# Patient Record
Sex: Female | Born: 2000
Health system: Southern US, Community
[De-identification: ages and names within clinical notes are randomized; demographics above are authoritative.]

---
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
## 2020-08-28 DIAGNOSIS — R300 Dysuria: Secondary | ICD-10-CM | POA: Diagnosis not present

## 2020-09-16 ENCOUNTER — Encounter (HOSPITAL_COMMUNITY): Payer: Self-pay

## 2020-09-16 ENCOUNTER — Ambulatory Visit (INDEPENDENT_AMBULATORY_CARE_PROVIDER_SITE_OTHER): Payer: 59

## 2020-09-16 ENCOUNTER — Other Ambulatory Visit: Payer: Self-pay

## 2020-09-16 ENCOUNTER — Ambulatory Visit (HOSPITAL_COMMUNITY)
Admission: EM | Admit: 2020-09-16 | Discharge: 2020-09-16 | Disposition: A | Discharge status: Home / Self Care | Payer: 59

## 2020-09-16 DIAGNOSIS — M25561 Pain in right knee: Secondary | ICD-10-CM | POA: Diagnosis not present

## 2020-09-16 DIAGNOSIS — M25461 Effusion, right knee: Secondary | ICD-10-CM | POA: Diagnosis not present

## 2020-09-16 DIAGNOSIS — M7989 Other specified soft tissue disorders: Secondary | ICD-10-CM | POA: Diagnosis not present

## 2020-09-16 MED ORDER — CYCLOBENZAPRINE HCL 5 MG PO TABS: 5.0000 mg | ORAL_TABLET | Freq: Every evening | ORAL | 0 refills | Status: AC | PRN

## 2020-09-16 MED ORDER — IBUPROFEN 800 MG PO TABS: 800.0000 mg | ORAL_TABLET | Freq: Three times a day (TID) | ORAL | 0 refills | Status: AC

## 2020-09-16 NOTE — Discharge Instructions (Addendum)
Can use ibuprofen three times a day with meals  Can use muscle relaxer at bedtime as needed  Can use heating pad in 15 minute intervals  Gentle range of motion exercises as tolerated  Can follow up with the urgent care or orthopedics if worsening pain, decreased movement, numbness or tingling begins

## 2020-09-16 NOTE — ED Triage Notes (Signed)
Pt c/o acute right medial knee pain, radiating to posterior thigh onset last night. Pt states as she bent down to remove clothing from dryer last night, she experienced pain to her knee.  Pt reports swelling and bruising to medial portion to knee.  Denies numbness/tingling to foot.  Took tylenol last night. Is currently being tx for UTI but has missed several doses of her ABX.

## 2020-09-16 NOTE — ED Provider Notes (Signed)
Lauren Irwin    CSN: [id number] Arrival date & time: 09/16/20  2992      History   Chief Complaint Chief Complaint  Patient presents with  . Knee Pain    HPI Lauren Irwin is a 20 y.o. female.   Patient presents with right knee pain  9/10 radiating to posterior thigh beginning yesterday evening after bending over. Painful when bearing weight. ROM intact. Reports swelling and bruising last night. Denies numbness and tingling. Attempted tylenol with no relief  History reviewed. No pertinent past medical history.  There are no problems to display for this patient.   History reviewed. No pertinent surgical history.  OB History   No obstetric history on file.      Home Medications    Prior to Admission medications   Medication Sig Start Date End Date Taking? Authorizing Provider  nitrofurantoin, macrocrystal-monohydrate, (MACROBID) 100 MG capsule Take 100 mg by mouth 2 (two) times daily. 08/28/20  Yes [provider]  albuterol (PROVENTIL HFA;VENTOLIN HFA) 108 (90 Base) MCG/ACT inhaler Inhale 1-2 puffs into the lungs every 6 (six) hours as needed for wheezing or shortness of breath. 08/18/18   Melynda Ripple, MD  fluticasone (FLONASE) 50 MCG/ACT nasal spray Place 2 sprays into both nostrils daily. 08/18/18   Melynda Ripple, MD  Spacer/Aero-Holding Chambers (AEROCHAMBER PLUS) inhaler Use as instructed 08/18/18   Melynda Ripple, MD    Family History History reviewed. No pertinent family history.  Social History Social History   Tobacco Use  . Smoking status: Never Smoker  . Smokeless tobacco: Never Used  Substance Use Topics  . Alcohol use: No  . Drug use: No     Allergies   Other   Review of Systems Review of Systems  Constitutional: Negative.   Respiratory: Negative.   Cardiovascular: Negative.   Gastrointestinal: Negative.   Genitourinary: Negative.   Musculoskeletal: Negative.   Neurological: Negative.      Physical  Exam Triage Vital Signs ED Triage Vitals  Enc Vitals Group     BP 09/16/20 1037 123/77     Pulse Rate 09/16/20 1037 86     Resp 09/16/20 1037 18     Temp 09/16/20 1037 98 F (36.7 C)     Temp Source 09/16/20 1037 Oral     SpO2 09/16/20 1037 100 %     Weight --      Height --      Head Circumference --      Peak Flow --      Pain Score 09/16/20 1033 9     Pain Loc --      Pain Edu? --      Excl. in [place]? --    No data found.  Updated Vital Signs BP 123/77 (BP Location: Right Arm)   Pulse 86   Temp 98 F (36.7 C) (Oral)   Resp 18   LMP 09/03/2020 (Approximate)   SpO2 100%   Visual Acuity Right Eye Distance:   Left Eye Distance:   Bilateral Distance:    Right Eye Near:   Left Eye Near:    Bilateral Near:     Physical Exam Constitutional:      Appearance: Normal appearance. She is normal weight.  HENT:     Head: Normocephalic.  Eyes:     Extraocular Movements: Extraocular movements intact.  Pulmonary:     Effort: Pulmonary effort is normal.  Musculoskeletal:     Right knee: No swelling, effusion, erythema or ecchymosis.  Legs:     Comments: Tenderness over MCL, above and below the patella, patient guarding and shaking during palpation.   Skin:    General: Skin is warm and dry.  Neurological:     Mental Status: She is alert and oriented to person, place, and time. Mental status is at baseline.  Psychiatric:        Mood and Affect: Mood normal.        Behavior: Behavior normal.        Thought Content: Thought content normal.        Judgment: Judgment normal.      UC Treatments / Results  Labs (all labs ordered are listed, but only abnormal results are displayed) Labs Reviewed - No data to display  EKG   Radiology No results found.  Procedures Procedures (including critical care time)  Medications Ordered in UC Medications - No data to display  Initial Impression / Assessment and Plan / UC Course  I have reviewed the triage vital signs  and the nursing notes.  Pertinent labs & imaging results that were available during my care of the patient were reviewed by me and considered in my medical decision making (see chart for details).  Acute right knee pain   1 xray negative 2. Ibuprofen 800 mg tid 3. Flexeril 5mg  at bedtime prn 4.heating pad 15 minute intervals 5. ROM exercises as tolerated 6. Follow up for worsening pain, decreased ROM, numbness tingling, verbalized understanding  Final Clinical Impressions(s) / UC Diagnoses   Final diagnoses:  None   Discharge Instructions   None    ED Prescriptions    None     PDMP not reviewed this encounter.   Hans Eden, NP 09/16/20 1144

## 2020-09-20 ENCOUNTER — Telehealth: Payer: Self-pay

## 2020-09-20 NOTE — Telephone Encounter (Signed)
Mother called for pt says pt needs her immunization record printed for college. Alternate phone number is [phone] (Mother's work)

## 2020-09-27 NOTE — Telephone Encounter (Signed)
Did you try to print this?

## 2020-09-27 NOTE — Telephone Encounter (Signed)
Yes this is Gosrani's PT there was no imm record in the chart

## 2021-05-25 ENCOUNTER — Emergency Department (HOSPITAL_COMMUNITY)
Admission: EM | Admit: 2021-05-25 | Discharge: 2021-05-26 | Disposition: A | Discharge status: Home / Self Care | Payer: 59 | Attending: Emergency Medicine | Admitting: Emergency Medicine

## 2021-05-25 ENCOUNTER — Emergency Department (HOSPITAL_COMMUNITY): Payer: 59

## 2021-05-25 ENCOUNTER — Other Ambulatory Visit: Payer: Self-pay

## 2021-05-25 ENCOUNTER — Encounter (HOSPITAL_COMMUNITY): Payer: Self-pay | Admitting: Emergency Medicine

## 2021-05-25 DIAGNOSIS — S80211A Abrasion, right knee, initial encounter: Secondary | ICD-10-CM | POA: Insufficient documentation

## 2021-05-25 DIAGNOSIS — M25562 Pain in left knee: Secondary | ICD-10-CM

## 2021-05-25 DIAGNOSIS — Y92410 Unspecified street and highway as the place of occurrence of the external cause: Secondary | ICD-10-CM | POA: Insufficient documentation

## 2021-05-25 DIAGNOSIS — T148XXA Other injury of unspecified body region, initial encounter: Secondary | ICD-10-CM

## 2021-05-25 DIAGNOSIS — Z23 Encounter for immunization: Secondary | ICD-10-CM | POA: Diagnosis not present

## 2021-05-25 DIAGNOSIS — S8991XA Unspecified injury of right lower leg, initial encounter: Secondary | ICD-10-CM | POA: Diagnosis present

## 2021-05-25 DIAGNOSIS — S299XXA Unspecified injury of thorax, initial encounter: Secondary | ICD-10-CM | POA: Diagnosis not present

## 2021-05-25 DIAGNOSIS — S70212A Abrasion, left hip, initial encounter: Secondary | ICD-10-CM | POA: Insufficient documentation

## 2021-05-25 DIAGNOSIS — R000 Tachycardia, unspecified: Secondary | ICD-10-CM | POA: Diagnosis not present

## 2021-05-25 DIAGNOSIS — R079 Chest pain, unspecified: Secondary | ICD-10-CM | POA: Diagnosis not present

## 2021-05-25 DIAGNOSIS — M25561 Pain in right knee: Secondary | ICD-10-CM | POA: Diagnosis not present

## 2021-05-25 DIAGNOSIS — M25552 Pain in left hip: Secondary | ICD-10-CM | POA: Diagnosis not present

## 2021-05-25 DIAGNOSIS — S80919A Unspecified superficial injury of unspecified knee, initial encounter: Secondary | ICD-10-CM | POA: Diagnosis not present

## 2021-05-25 DIAGNOSIS — S199XXA Unspecified injury of neck, initial encounter: Secondary | ICD-10-CM | POA: Diagnosis not present

## 2021-05-25 DIAGNOSIS — R0789 Other chest pain: Secondary | ICD-10-CM | POA: Diagnosis not present

## 2021-05-25 DIAGNOSIS — S80212A Abrasion, left knee, initial encounter: Secondary | ICD-10-CM | POA: Insufficient documentation
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
## 2021-05-26 ENCOUNTER — Emergency Department (HOSPITAL_COMMUNITY): Payer: 59

## 2021-05-26 DIAGNOSIS — M25552 Pain in left hip: Secondary | ICD-10-CM | POA: Diagnosis not present

## 2021-05-26 DIAGNOSIS — S80212A Abrasion, left knee, initial encounter: Secondary | ICD-10-CM | POA: Diagnosis not present

## 2021-05-26 DIAGNOSIS — S299XXA Unspecified injury of thorax, initial encounter: Secondary | ICD-10-CM | POA: Diagnosis not present

## 2021-05-26 DIAGNOSIS — S80211A Abrasion, right knee, initial encounter: Secondary | ICD-10-CM | POA: Diagnosis not present

## 2021-05-26 DIAGNOSIS — Z23 Encounter for immunization: Secondary | ICD-10-CM | POA: Diagnosis not present

## 2021-05-26 DIAGNOSIS — S70212A Abrasion, left hip, initial encounter: Secondary | ICD-10-CM | POA: Diagnosis not present

## 2021-05-26 LAB — POC URINE PREG, ED: Preg Test, Ur: NEGATIVE

## 2021-05-26 MED ORDER — TETANUS-DIPHTH-ACELL PERTUSSIS 5-2.5-18.5 LF-MCG/0.5 IM SUSY
0.5000 mL | PREFILLED_SYRINGE | Freq: Once | INTRAMUSCULAR | Status: AC
  Administered 2021-05-26: 0.5 mL via INTRAMUSCULAR
  Filled 2021-05-26: qty 0.5

## 2021-05-26 MED ORDER — ACETAMINOPHEN 325 MG PO TABS
650.0000 mg | ORAL_TABLET | Freq: Once | ORAL | Status: AC
  Administered 2021-05-26: 650 mg via ORAL
  Filled 2021-05-26: qty 2

## 2021-05-26 MED ORDER — NAPROXEN 500 MG PO TABS: 500.0000 mg | ORAL_TABLET | Freq: Two times a day (BID) | ORAL | 0 refills | Status: DC

## 2021-05-26 MED ORDER — BACITRACIN ZINC 500 UNIT/GM EX OINT: 1.0000 "application " | TOPICAL_OINTMENT | Freq: Two times a day (BID) | CUTANEOUS | 0 refills | Status: DC

## 2021-05-26 MED ORDER — NAPROXEN 500 MG PO TABS: 500.0000 mg | ORAL_TABLET | Freq: Two times a day (BID) | ORAL | 0 refills | Status: AC

## 2021-05-26 MED ORDER — LIDOCAINE 5 % EX PTCH: 1.0000 | MEDICATED_PATCH | CUTANEOUS | 0 refills | Status: AC

## 2021-05-26 MED ORDER — BACITRACIN ZINC 500 UNIT/GM EX OINT: 1.0000 "application " | TOPICAL_OINTMENT | Freq: Two times a day (BID) | CUTANEOUS | 0 refills | Status: AC

## 2021-05-26 NOTE — ED Notes (Signed)
Pt verbalizes understanding of discharge instructions. Opportunity for questions and answers were provided. Pt discharged from the ED.   ?
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
## 2021-05-30 DIAGNOSIS — M62838 Other muscle spasm: Secondary | ICD-10-CM | POA: Diagnosis not present

## 2021-05-30 DIAGNOSIS — R0789 Other chest pain: Secondary | ICD-10-CM | POA: Diagnosis not present

## 2021-05-30 DIAGNOSIS — M546 Pain in thoracic spine: Secondary | ICD-10-CM | POA: Diagnosis not present

## 2021-05-30 DIAGNOSIS — G4709 Other insomnia: Secondary | ICD-10-CM | POA: Diagnosis not present

## 2022-04-07 DIAGNOSIS — D229 Melanocytic nevi, unspecified: Secondary | ICD-10-CM | POA: Diagnosis not present

## 2022-04-23 ENCOUNTER — Telehealth: Payer: Self-pay | Admitting: Plastic Surgery

## 2022-04-23 NOTE — Telephone Encounter (Signed)
LVM to see if pt could come in at 8:15 instead of 8:30 due to provider needing 45 minute consult time frame.

## 2022-05-07 ENCOUNTER — Ambulatory Visit (INDEPENDENT_AMBULATORY_CARE_PROVIDER_SITE_OTHER): Payer: 59 | Admitting: Plastic Surgery

## 2022-05-07 ENCOUNTER — Encounter: Payer: Self-pay | Admitting: Plastic Surgery

## 2022-05-07 VITALS — BP 121/84 | HR 99 | Ht 65.0 in | Wt 97.4 lb

## 2022-05-07 DIAGNOSIS — L989 Disorder of the skin and subcutaneous tissue, unspecified: Secondary | ICD-10-CM | POA: Diagnosis not present

## 2022-05-07 NOTE — Progress Notes (Signed)
   Referring Provider Lauren Chimes, PA-C [place],  [place] [id number]   CC:  Chief Complaint  Patient presents with   Advice Only      Lauren Irwin is an 21 y.o. female.  HPI: Lauren Irwin presents with a request to remove a benign-appearing skin lesion on the left side of her chin.  The lesion has been there for many years however she notes that it has been growing slowly over the past few months and has become painful.  She would like to have it removed to ensure that there is no for logic concerns.  Allergies  Allergen Reactions   Other     DEET and Peanuts     Outpatient Encounter Medications as of 05/07/2022  Medication Sig   acetaminophen (TYLENOL) 500 MG tablet Take 1,000 mg by mouth every 6 (six) hours as needed for moderate pain or headache.   albuterol (PROVENTIL HFA;VENTOLIN HFA) 108 (90 Base) MCG/ACT inhaler Inhale 1-2 puffs into the lungs every 6 (six) hours as needed for wheezing or shortness of breath.   bacitracin ointment Apply 1 application topically 2 (two) times daily.   cyclobenzaprine (FLEXERIL) 5 MG tablet Take 1 tablet (5 mg total) by mouth at bedtime as needed for muscle spasms.   fluticasone (FLONASE) 50 MCG/ACT nasal spray Place 2 sprays into both nostrils daily.   ibuprofen (ADVIL) 800 MG tablet Take 1 tablet (800 mg total) by mouth 3 (three) times daily.   lidocaine (LIDODERM) 5 % Place 1 patch onto the skin daily. Remove & Discard patch within 12 hours or as directed by MD   naproxen (NAPROSYN) 500 MG tablet Take 1 tablet (500 mg total) by mouth 2 (two) times daily.   Spacer/Aero-Holding Chambers (AEROCHAMBER PLUS) inhaler Use as instructed   No facility-administered encounter medications on file as of 05/07/2022.     No past medical history on file.  No past surgical history on file.  No family history on file.  Social History   Social History Narrative   Not on file     Review of Systems General: Denies fevers, chills,  weight loss CV: Denies chest pain, shortness of breath, palpitations Skin: 5 mm protruding benign-appearing lesion on the chin.  Physical Exam    05/07/2022    8:35 AM 05/26/2021    9:00 AM 05/26/2021    8:30 AM  Vitals with BMI  Height '5\' 5"'$     Weight 97 lbs 6 oz    BMI 86.76    Systolic [phone]  Diastolic 84 71 78  Pulse 99 88 98    General:  No acute distress,  Alert and oriented, Non-Toxic, Normal speech and affect Integument: As noted above small benign-appearing lesion on the chin.  Assessment/Plan Benign skin lesion: We will plan excision of the lesion under local in the clinic.  Do believe since it has been growing slowly and there is some pain involved that this should be sent to pathology for routine examination.  The patient and I discussed the procedure including the fact that there will be a small scar at the surgical site and that she will have sutures will need to be removed 5 to 7 days postoperatively she understands and request that I proceed.  Lauren Irwin 05/07/2022, 8:43 AM

## 2022-05-11 ENCOUNTER — Ambulatory Visit: Payer: 59 | Admitting: Plastic Surgery

## 2022-05-11 VITALS — BP 129/86 | HR 85 | Wt 95.4 lb

## 2022-05-11 DIAGNOSIS — D229 Melanocytic nevi, unspecified: Secondary | ICD-10-CM

## 2022-05-11 DIAGNOSIS — D2239 Melanocytic nevi of other parts of face: Secondary | ICD-10-CM | POA: Diagnosis not present

## 2022-05-11 NOTE — Progress Notes (Signed)
Procedure Note  Preoperative Dx: Nevus, chin  Postoperative Dx: Same  Procedure: Excision of 5 mm nevus, chin  Anesthesia: Lidocaine 1% with 1:100,000 epinephrine and .25% marcaine  Indication for Procedure: [name] presented to my clinic for evaluation of a nevus on her chin.  The nevus had been growing slowly over the past few years.  She requested excision of the nevus to rule out significant pathology.  Description of Procedure: Risks and complications were explained to the patient including scar formation bleeding and infection..  Consent was confirmed and the patient understands the risks and benefits.  The potential complications and alternatives were explained and the patient consents.  The patient expressed understanding the option of not having the procedure and the risks of a scar.  Time out was called and all information was confirmed to be correct.    The area was prepped and drapped.  Local anesthesia was injected in the subcutaneous area.  After waiting several minutes for the local to take affect an #11 blade was used to excise the area in an eliptical pattern.  A 5-0 Prolene was used to close the incision with simple interrupted stitches.  A dressing was applied.  The patient was given instructions on how to care for the area and a follow up appointment in 5 to 7 days for suture removal.  [place] tolerated the procedure well and there were no complications. The specimen was sent to pathology with the biopsy report from the referring physician.

## 2022-05-15 ENCOUNTER — Ambulatory Visit (INDEPENDENT_AMBULATORY_CARE_PROVIDER_SITE_OTHER): Payer: 59 | Admitting: Plastic Surgery

## 2022-05-15 DIAGNOSIS — D229 Melanocytic nevi, unspecified: Secondary | ICD-10-CM

## 2022-05-15 NOTE — Progress Notes (Signed)
   Referring Provider No referring provider defined for this encounter.   CC: No chief complaint on file.     Lauren Irwin is an 21 y.o. female.  HPI: Ms. Jackowski returns today for suture removal.  She is only 4 days postop.  Allergies  Allergen Reactions   Other     DEET and Peanuts     Outpatient Encounter Medications as of 05/15/2022  Medication Sig   acetaminophen (TYLENOL) 500 MG tablet Take 1,000 mg by mouth every 6 (six) hours as needed for moderate pain or headache.   albuterol (PROVENTIL HFA;VENTOLIN HFA) 108 (90 Base) MCG/ACT inhaler Inhale 1-2 puffs into the lungs every 6 (six) hours as needed for wheezing or shortness of breath.   bacitracin ointment Apply 1 application topically 2 (two) times daily.   cyclobenzaprine (FLEXERIL) 5 MG tablet Take 1 tablet (5 mg total) by mouth at bedtime as needed for muscle spasms.   fluticasone (FLONASE) 50 MCG/ACT nasal spray Place 2 sprays into both nostrils daily.   ibuprofen (ADVIL) 800 MG tablet Take 1 tablet (800 mg total) by mouth 3 (three) times daily.   lidocaine (LIDODERM) 5 % Place 1 patch onto the skin daily. Remove & Discard patch within 12 hours or as directed by MD   naproxen (NAPROSYN) 500 MG tablet Take 1 tablet (500 mg total) by mouth 2 (two) times daily.   Spacer/Aero-Holding Chambers (AEROCHAMBER PLUS) inhaler Use as instructed   No facility-administered encounter medications on file as of 05/15/2022.     No past medical history on file.  No past surgical history on file.  No family history on file.  Social History   Social History Narrative   Not on file     Review of Systems General: Denies fevers, chills, weight loss CV: Denies chest pain, shortness of breath, palpitations Skin: No complaints sutures in place  Physical Exam    05/11/2022    8:08 AM 05/07/2022    8:35 AM 05/26/2021    9:00 AM  Vitals with BMI  Height  '5\' 5"'$    Weight 95 lbs 6 oz 97 lbs 6 oz   BMI 33.54 56.25   Systolic 638  937 342  Diastolic 86 84 71  Pulse 85 99 88    General:  No acute distress,  Alert and oriented, Non-Toxic, Normal speech and affect Integument: Sutures in place Mammogram: Not applicable Assessment/Plan Status post removal of nevus: She was accidentally booked into an appointment today she is not ready for suture removal yet.  She will return on Monday to have her sutures removed.  Camillia Herter 05/15/2022, 5:10 PM

## 2022-05-18 ENCOUNTER — Ambulatory Visit (INDEPENDENT_AMBULATORY_CARE_PROVIDER_SITE_OTHER): Payer: 59 | Admitting: Physician Assistant

## 2022-05-18 DIAGNOSIS — L989 Disorder of the skin and subcutaneous tissue, unspecified: Secondary | ICD-10-CM

## 2022-05-18 NOTE — Progress Notes (Signed)
Patient is a pleasant 21 year old female with PMH of 5 mm chin nevus s/p excision in the office performed 05/11/2022 who returns for postprocedural follow-up and suture removal.  Reviewed procedural note and the excision site was closed with 5-0 Prolene simple interrupted stitches.  Pathology has not yet resulted.  Today, patient is doing well.  No complaints.  Feels prepared for suture removal.  Physical exam is entirely reassuring.  3 simple interrupted Prolene sutures are removed without complication or difficulty.  No wounds or drainage noted.  Area is mildly erythematous, suspect due to mild dryness.  No evidence concerning for infection.  Recommend Vaseline daily x7 days and then transitioning to a scar mitigation treatment.  Discussed options with patient.  Picture(s) obtained of the patient and placed in the chart were with the patient's or guardian's permission.  No specific follow-up needed, but she can certainly call clinic should she have any questions or concerns.

## 2022-06-17 NOTE — Telephone Encounter (Signed)
error 

## 2022-07-11 DIAGNOSIS — H5213 Myopia, bilateral: Secondary | ICD-10-CM | POA: Diagnosis not present
